# Patient Record
Sex: Female | Born: 1960 | Race: White | Hispanic: No | Marital: Married | Smoking: Never smoker
Health system: Southern US, Community
[De-identification: ages and names within clinical notes are randomized; demographics above are authoritative.]

## PROBLEM LIST (undated history)

## (undated) DIAGNOSIS — I10 Essential (primary) hypertension: Secondary | ICD-10-CM

## (undated) DIAGNOSIS — C801 Malignant (primary) neoplasm, unspecified: Secondary | ICD-10-CM

## (undated) DIAGNOSIS — F329 Major depressive disorder, single episode, unspecified: Secondary | ICD-10-CM

## (undated) DIAGNOSIS — F32A Depression, unspecified: Secondary | ICD-10-CM

## (undated) DIAGNOSIS — F419 Anxiety disorder, unspecified: Secondary | ICD-10-CM

## (undated) HISTORY — PX: DILATION AND CURETTAGE OF UTERUS: SHX78

## (undated) HISTORY — PX: CERVICAL SPINE SURGERY: SHX589

## (undated) HISTORY — PX: ABDOMINAL HYSTERECTOMY: SHX81

## (undated) HISTORY — PX: MASTECTOMY: SHX3

## (undated) HISTORY — PX: BLADDER SUSPENSION: SHX72

## (undated) SURGERY — RECONSTRUCTION, BREAST
Anesthesia: General | Laterality: Bilateral

---

## 2006-03-06 ENCOUNTER — Encounter: Admission: RE | Admit: 2006-03-06 | Discharge: 2006-03-06 | Payer: Self-pay | Admitting: Unknown Physician Specialty

## 2006-08-05 ENCOUNTER — Inpatient Hospital Stay (HOSPITAL_COMMUNITY): Admission: RE | Admit: 2006-08-05 | Discharge: 2006-08-06 | Payer: Self-pay | Admitting: Neurosurgery

## 2008-06-14 ENCOUNTER — Inpatient Hospital Stay (HOSPITAL_COMMUNITY): Admission: RE | Admit: 2008-06-14 | Discharge: 2008-06-17 | Payer: Self-pay | Admitting: Neurosurgery

## 2008-06-14 IMAGING — CR DG CHEST 2V
2 series · 2 of 2 positions shown · non-contrast
Comparison: [DATE].

CLINICAL DATA: Preop cervical spondylosis.

CHEST - 2 VIEW

[view not recorded (1 of 2)]
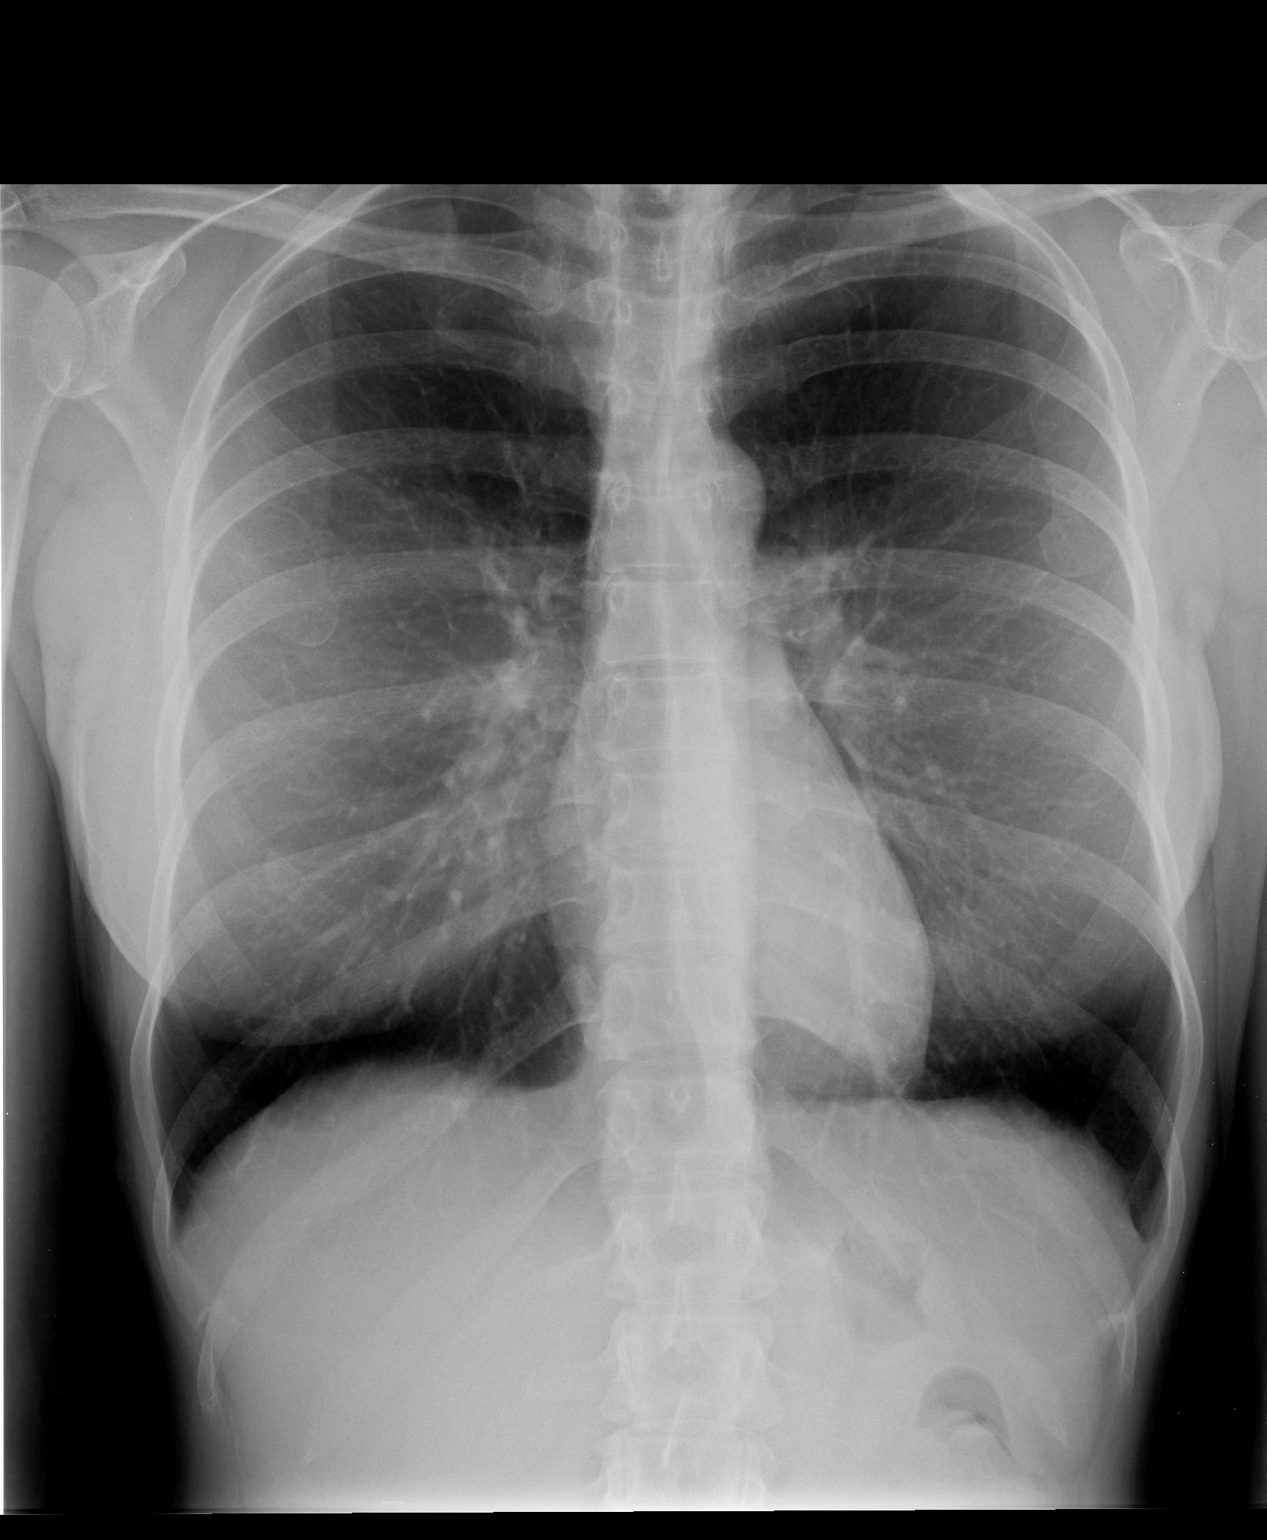

[view not recorded (2 of 2)]
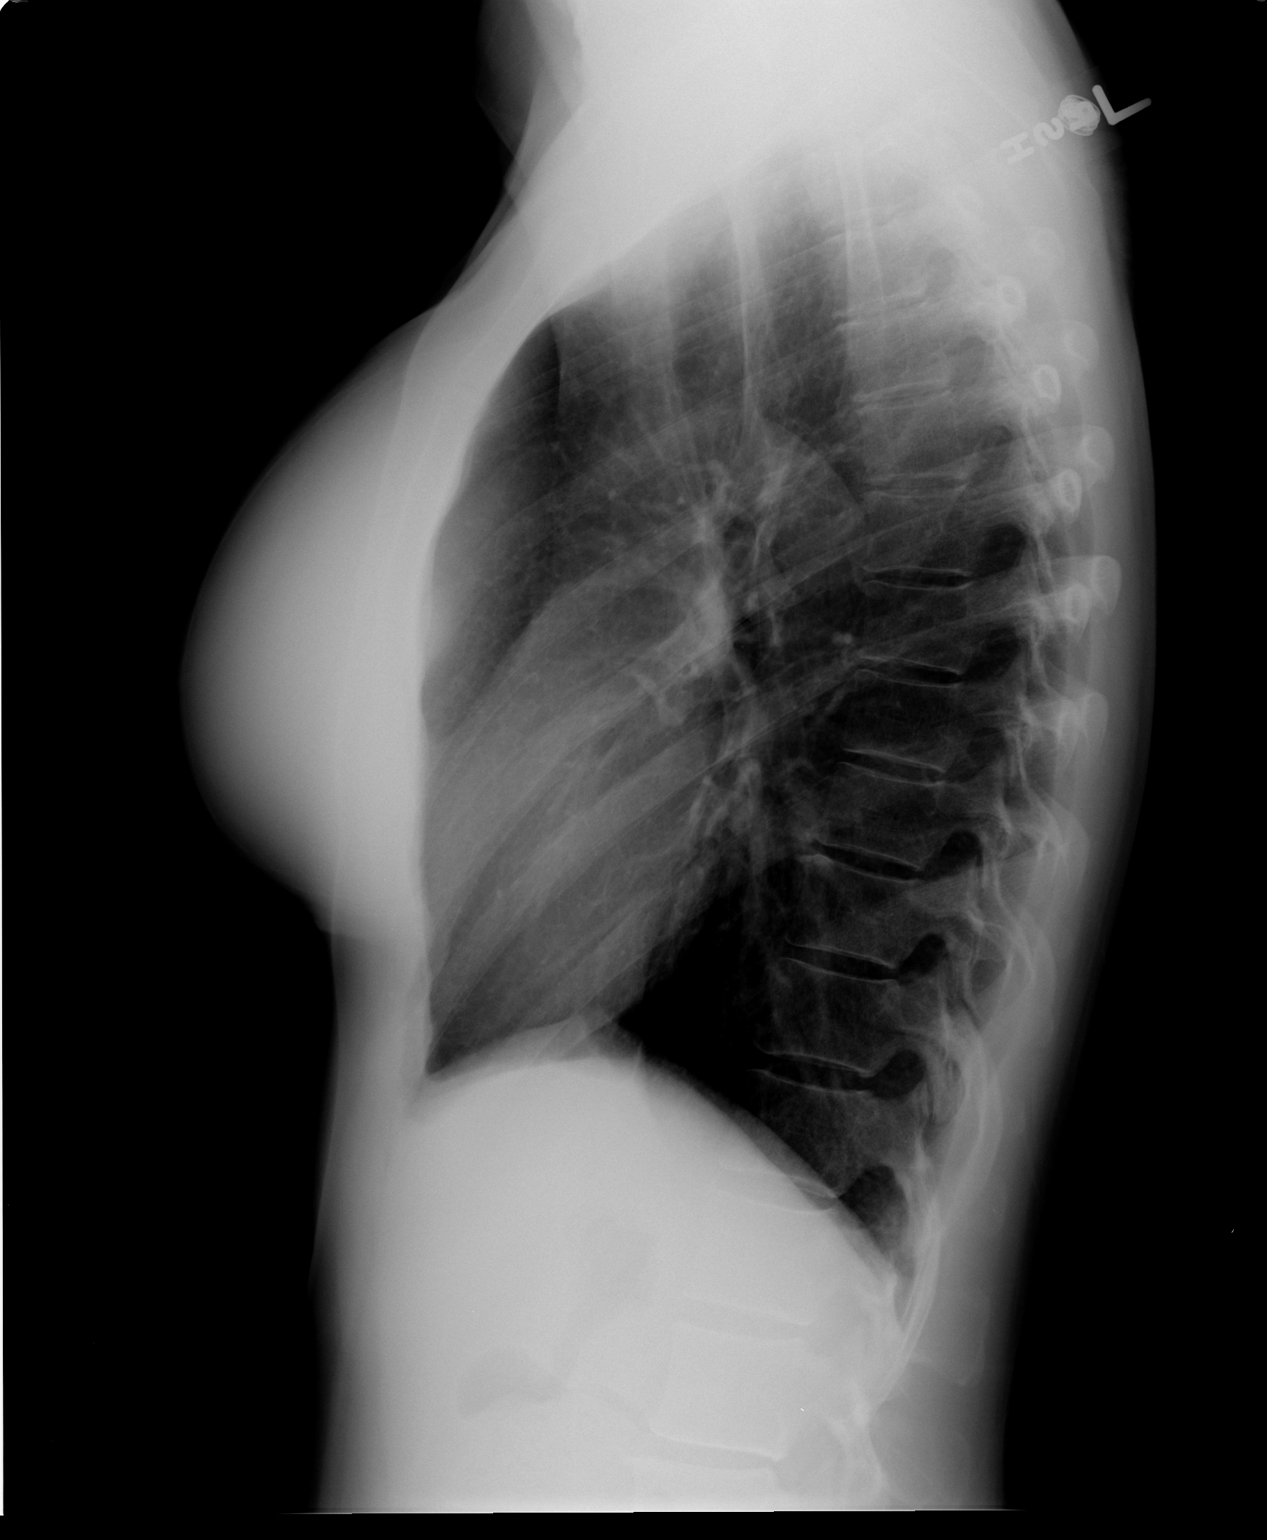

[2 of 2 positions shown; findings below may reference images not displayed]

FINDINGS: The cardiac and mediastinal silhouette appear normal.
The lung fields are clear.  There is no bony abnormality.  No
effusion or pneumothorax is seen.  The cervical plate is
identified in the lower neck.
IMPRESSION: No active disease, and no significant change from priors.

## 2009-11-30 ENCOUNTER — Observation Stay (HOSPITAL_COMMUNITY): Admission: EM | Admit: 2009-11-30 | Discharge: 2009-11-30 | Payer: Self-pay | Admitting: Emergency Medicine

## 2009-11-30 ENCOUNTER — Ambulatory Visit: Payer: Self-pay | Admitting: Internal Medicine

## 2009-11-30 ENCOUNTER — Encounter (INDEPENDENT_AMBULATORY_CARE_PROVIDER_SITE_OTHER): Payer: Self-pay | Admitting: Emergency Medicine

## 2009-12-17 ENCOUNTER — Ambulatory Visit: Payer: Self-pay | Admitting: Diagnostic Radiology

## 2009-12-17 ENCOUNTER — Emergency Department (HOSPITAL_BASED_OUTPATIENT_CLINIC_OR_DEPARTMENT_OTHER): Admission: EM | Admit: 2009-12-17 | Discharge: 2009-12-17 | Payer: Self-pay | Admitting: Emergency Medicine

## 2010-03-27 ENCOUNTER — Encounter
Admission: RE | Admit: 2010-03-27 | Discharge: 2010-03-27 | Payer: Self-pay | Source: Home / Self Care | Attending: Neurosurgery | Admitting: Neurosurgery

## 2010-04-22 ENCOUNTER — Encounter: Payer: Self-pay | Admitting: Unknown Physician Specialty

## 2010-06-14 LAB — URINALYSIS, ROUTINE W REFLEX MICROSCOPIC
Bilirubin Urine: NEGATIVE
Hgb urine dipstick: NEGATIVE
Nitrite: NEGATIVE
Protein, ur: NEGATIVE mg/dL
Specific Gravity, Urine: 1.012 (ref 1.005–1.030)
Urobilinogen, UA: 0.2 mg/dL (ref 0.0–1.0)
pH: 6 (ref 5.0–8.0)

## 2010-06-14 LAB — DIFFERENTIAL
Basophils Absolute: 0 10*3/uL (ref 0.0–0.1)
Basophils Relative: 1 % (ref 0–1)
Eosinophils Relative: 3 % (ref 0–5)
Lymphs Abs: 2.3 10*3/uL (ref 0.7–4.0)
Monocytes Absolute: 0.5 10*3/uL (ref 0.1–1.0)
Monocytes Relative: 8 % (ref 3–12)

## 2010-06-14 LAB — BASIC METABOLIC PANEL
BUN: 12 mg/dL (ref 6–23)
Chloride: 102 mEq/L (ref 96–112)
GFR calc non Af Amer: >60 mL/min (ref >60)
Potassium: 4.4 mEq/L (ref 3.5–5.1)

## 2010-06-14 LAB — CBC
HCT: 43.1 % (ref 36.0–46.0)
Hemoglobin: 15 g/dL (ref 12.0–15.0)
MCV: 95.6 fL (ref 78.0–100.0)
WBC: 6.2 10*3/uL (ref 4.0–10.5)

## 2010-06-14 LAB — POCT CARDIAC MARKERS: Myoglobin, poc: 44.4 ng/mL (ref 12–200)

## 2010-07-12 LAB — COMPREHENSIVE METABOLIC PANEL
ALT: 23 U/L (ref 0–35)
AST: 29 U/L (ref 0–37)
Albumin: 3.6 g/dL (ref 3.5–5.2)
CO2: 28 mEq/L (ref 19–32)
Chloride: 104 mEq/L (ref 96–112)
Glucose, Bld: 60 mg/dL — ABNORMAL LOW (ref 70–99)
Sodium: 140 mEq/L (ref 135–145)
Total Protein: 6.3 g/dL (ref 6.0–8.3)

## 2010-07-12 LAB — CBC
HCT: 39.2 % (ref 36.0–46.0)
MCHC: 35.1 g/dL (ref 30.0–36.0)
MCV: 94.6 fL (ref 78.0–100.0)
WBC: 5.5 10*3/uL (ref 4.0–10.5)

## 2010-07-12 LAB — URINALYSIS, ROUTINE W REFLEX MICROSCOPIC
Bilirubin Urine: NEGATIVE
Protein, ur: NEGATIVE mg/dL
Urobilinogen, UA: 0.2 mg/dL (ref 0.0–1.0)

## 2010-07-12 LAB — URINE MICROSCOPIC-ADD ON

## 2010-07-12 LAB — DIFFERENTIAL
Basophils Relative: 0 % (ref 0–1)
Eosinophils Absolute: 0.1 10*3/uL (ref 0.0–0.7)
Eosinophils Relative: 2 % (ref 0–5)
Lymphocytes Relative: 43 % (ref 12–46)
Monocytes Relative: 9 % (ref 3–12)
Neutro Abs: 2.5 10*3/uL (ref 1.7–7.7)

## 2010-07-12 LAB — APTT: aPTT: 28 seconds (ref 24–37)

## 2010-08-14 NOTE — Discharge Summary (Signed)
NAMERASHUNDA, PASSON                 ACCOUNT NO.:  [id number]   MEDICAL RECORD NO.:  [id number]          PATIENT TYPE:  INP   LOCATION:  3014                         FACILITY:  [facility]   PHYSICIAN:  Payton Doughty, M.D.      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   PROCEDURE:  C6-7 posterior cervical fusion.   DICTATING DOCTOR:  Payton Doughty, MD   COMPLICATIONS:  None.   DISCHARGE STATUS:  Alive and well.   BODY OF THE TEXT:  A 50 year old right handed white girl whose history  and physical is recounted in the chart.  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  She was admitted for  posterior fusion.   MEDICAL HISTORY:  Remarkable for breast cancer, no troubles now.   MEDICATIONS:  Celebrex, Elavil, and Vicodin.   ALLERGIES:  None.   General exam is intact.  Neurologic exam is intact with neck pain with  neck motion.  Films showed nonunion.  She was admitted after  ascertaining normal laboratory values and underwent posterior cervical  fusion.  Postoperatively, she has done well.  Obviously quite sore after  the muscle dissection.  Spent 1 day getting up and about, another day  mobilizing and getting her feet under.  She did not feel particularly  well yesterday.  Today, she feels much better and ready to go home, meet  her own needs at home.  Her strength is full.  Her incision is dry and  well healed.  She is being discharged home to the care of her family  with Vicodin for pain and her followup will be in the [facility]  for sutures in about 10 days.           ______________________________  Payton Doughty, M.D.     MWR/MEDQ  D:  06/17/2008  T:  06/17/2008  Job:  [id number]

## 2010-08-14 NOTE — H&P (Signed)
Michele Hahn, Michele Hahn                 ACCOUNT NO.:  [id number]   MEDICAL RECORD NO.:  [id number]          PATIENT TYPE:  INP   LOCATION:  3014                         FACILITY:  [facility]   PHYSICIAN:  Payton Doughty, M.D.      DATE OF BIRTH:  [birth year]   DATE OF ADMISSION:  06/14/2008  DATE OF DISCHARGE:                              HISTORY & PHYSICAL   ADMISSION DIAGNOSIS:  Nonunion at C6-C7.   DICTATING DOCTOR:  Payton Doughty, MD   SERVICE:  Neurosurgery.   BODY OF TEXT:  A very nice now 50 year old right-handed white lady who I  operated in 2007.  She had an anterior decompression and fusion and has  done relatively well.  She has increasing neck pain and plain films with  flexion/extension views showed a nonunion at C6-C7, fusion at C5-6 with  the solid C6-7 has not worked.  Screws are probably loosened at C6-C7  and she is now admitted for posterior revision of her fusion.   MEDICAL HISTORY:  Remarkable for breast cancer.  She had mastectomy in  April 2000, bladder sling in April 2006.   MEDICATIONS:  Celebrex, Elavil, and Vicodin.   She has no allergies.   SOCIAL HISTORY:  She does not smoke or drink.  She stays at home with  her children.   FAMILY HISTORY:  Mom is now 7.  Dad is 18, in good health.   REVIEW OF SYSTEMS:  Remarkable for back pain and neck pain.   PHYSICAL EXAMINATION:  HEENT:  Within normal limits.  NECK:  She has good range of motion of her neck.  CHEST:  Clear.  CARDIAC:  Regular rate and rhythm.  No murmur.  ABDOMEN:  Nontender.  No hepatosplenomegaly.  EXTREMITIES:  Without clubbing or cyanosis.  GU:  Deferred.  Peripheral pulses are good.  NEUROLOGIC:  She is awake, alert, and oriented.  Cranial nerves are  intact.  Motor exam shows 5/5 strength throughout the upper and lower  extremities.  She has pain with motion in her neck.  No current sensory  deficit.  Reflexes are intact.  Hoffman's is negative.   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   PLAN:  The plan is for a posterior cervical fusion at that level.  The  risks and benefits have been discussed with her and she wished to  proceed.           ______________________________  Payton Doughty, M.D.     MWR/MEDQ  D:  06/14/2008  T:  06/14/2008  Job:  [id number]

## 2010-08-14 NOTE — Op Note (Signed)
[name], Michele Hahn                 ACCOUNT NO.:  [id number]   MEDICAL RECORD NO.:  [id number]          PATIENT TYPE:  INP   LOCATION:  3014                         FACILITY:  [facility]   PHYSICIAN:  Payton Doughty, M.D.      DATE OF BIRTH:  1960/08/24   DATE OF PROCEDURE:  06/14/2008  DATE OF DISCHARGE:                               OPERATIVE REPORT   PREOPERATIVE DIAGNOSIS:  Nonunion C6-7.   POSTOPERATIVE DIAGNOSIS:  Nonunion C6-7.   PROCEDURE:  C6-7 posterior cervical fusion.   SURGEON:  Payton Doughty, MD, Neurosurgery.   ASSISTANT:  Hewitt Shorts, MD   ANESTHESIA:  General endotracheal.   PREPARATION:  Prepped and scrubbed with alcohol wipe.   COMPLICATIONS:  None.   BODY TEXT:  A 50 year old with fusion at C5-6 and C6-7 and a nonunion at  C6-7.  Taken to the operating room, smoothly anesthetized, intubated on  the Stryker bed, placed prone with pressure points padded.  Following  shave, prep, and drape in the usual sterile fashion, the skin was  incised over the C6-7 lamina and they were dissected free in the  subperiosteal plane, out over the lateral masses.  The intraoperative x-  ray showed with correct level.  Lateral mass screws were then placed in  C6 and C7 bilaterally using the standard landmarks.  They were connected  by rod and locking caps were placed and tightened.  The intraoperative x-  ray showed good placement of rods and screws.  The lamina and facet  joints were decorticated with a high-speed drill and packed with BMP on  the extender matrix.  The wound was irrigated.  Hemostasis was assured.  Successive layers of 0 Vicryl, 2-0 Vicryl, and 3-0 nylon were used to  close.  Betadine and Telfa dressing was applied and made occlusive with  OpSite, and the patient returned to the recovery room in good condition.           ______________________________  Payton Doughty, M.D.     MWR/MEDQ  D:  06/14/2008  T:  06/15/2008  Job:  [id number]

## 2010-08-17 NOTE — Op Note (Signed)
NAMERHYLEI, MCQUAIG                 ACCOUNT NO.:  [id number]   MEDICAL RECORD NO.:  [id number]          PATIENT TYPE:  AMB   LOCATION:  SDS                          FACILITY:  [facility]   PHYSICIAN:  Payton Doughty, M.D.      DATE OF BIRTH:  17-Jun-1960   DATE OF PROCEDURE:  08/05/2006  DATE OF DISCHARGE:                               OPERATIVE REPORT   PREOPERATIVE DIAGNOSIS:  Disk and spur at 5-6 and 6-7.   POSTOPERATIVE DIAGNOSES:  Disk and spur at 5-6 and 6-7.   OPERATIVE PROCEDURE:  C5-6, C6-7 anterior cervical decompression and  fusion with reflex hybrid plate.   SURGEON:  Payton Doughty, M.D.   ANESTHESIA:  General endotracheal anesthesia.   PREPARATION:  Prepped with alcohol wipe.   COMPLICATIONS:  None.   ASSISTANT:  No assistant.   DESCRIPTION OF PROCEDURE:  This is a 50 year old girl with spondylosis  and spur at C5-6 and C-6-7 taken to operating room, intubated, placed  supine on the operating table and the halter head traction with the neck  slightly extended.  Following shave, prep and drape in the usual sterile  fashion.  The skin was incised in the midline.  The medial border of the  sternocleidomastoid muscle on the left side, one fingerbreadth below the  level of carotid tubercle.  The platysma was identified, elevated,  divided and undermined.  Sternocleidomastoid was identified.  Medial  dissection revealed the carotid artery intact, laterally and to the  left.  Trachea and esophagus retracted laterally to the right, exposing  the bones of the anterior cervical spine.  Marker was placed.  Intraoperative x-ray obtained to confirm correct level.  Having  confirmed correct level, diskectomy was carried out at C5-6 and C6-7  under gross observation.  The operating microscope was then brought in.  We used microdissection technique to remove the remaining disks, explore  the neural foramen and divide the posterior longitudinal ligament.  On  the left side at each level  there was disk material out into the neural  foramen.  It  was removed without difficulty.  Neural foramina were  carefully explored and found to be open.  7 mm bone grafts were  fashioned with patellar allograft and tapped into place and the 34 mm  reflex hybrid plate was then placed with 12 mm screws, two in C-5, two  in C-6 and two in C-7.  Intraoperative x-ray showed good placement of  bone graft, plate and screws.  Wound was irrigated.  Hemostasis assured.  The esophagus carefully inspected, having no lesions.  Successive layers  of 3-0 Vicryl, 4-0 Vicryl used to close.  Benzoin, Steri-Strips were  placed.  A occlusive Telfa and OpSite.  The patient returned to recovery  room in good condition.          ______________________________  Payton Doughty, M.D.    MWR/MEDQ  D:  08/05/2006  T:  08/05/2006  Job:  [id number]

## 2010-08-17 NOTE — H&P (Signed)
Michele Hahn, BRINTON                 ACCOUNT NO.:  [id number]   MEDICAL RECORD NO.:  [id number]          PATIENT TYPE:  AMB   LOCATION:  SDS                          FACILITY:  [facility]   PHYSICIAN:  Payton Doughty, M.D.      DATE OF BIRTH:  03/09/1961   DATE OF ADMISSION:  08/05/2006  DATE OF DISCHARGE:                              HISTORY & PHYSICAL   ADMITTING DIAGNOSIS:  Spondylolysis, C5-6, C6-7.   SERVICE:  Neurosurgery.   HISTORY OF PRESENT ILLNESS:  This is a 50 year old, right-handed, white  lady who I saw initially for back pain in August.  She had a little bit  of a disc off the left, did well with epidural steroid injection.  In  the interim has developed pain in her neck with pain down her left arm  and subsequent MRI was obtained that demonstrated a disc at C6-7 and a  spur at 5-6, and she is admitted now for an anterior decompression and  fusion.   MEDICAL HISTORY:  Remarkable for breast cancer.  She had a mastectomy in  2000 and bladder sling in 2006.   MEDICATIONS:  Effexor.   ALLERGIES:  SHE HAS NO ALLERGIES.   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   FAMILY HISTORY:  Mom is 73 and in good health, dad 82 in good health.   REVIEW OF SYSTEMS:  Remarkable for back pain and pain to her leg.   HEENT EXAM:  Within normal limits.  NECK:  She has reasonable range of motion of the neck.  CHEST:  Clear.  CARDIAC EXAM:  Regular rate and rhythm without murmur.  ABDOMEN:  Nontender without hepatosplenomegaly.  EXTREMITIES:  Without clubbing or cyanosis.  GU EXAM:  Deferred.  Peripheral pulses are good.  NEUROLOGICALLY:  She is awake, alert, and oriented.  Cranial nerves are  intact.  Motor exam shows 5/5 strength throughout the upper extremities.  She does have pain when she turns her head towards her left.  She has no  triceps reflex on the left side.  Negative sensory deficits on the left  C7 distribution.  Lower extremities are  nonmyelopathic and Hoffmann is  negative.   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   PLAN:  Anterior decompression and fusion C5-6, C6-7.  The risks and  benefits have been discussed with her, and she wished to proceed.           ______________________________  Payton Doughty, M.D.     MWR/MEDQ  D:  08/05/2006  T:  08/05/2006  Job:  [id number]

## 2014-10-24 ENCOUNTER — Encounter (HOSPITAL_BASED_OUTPATIENT_CLINIC_OR_DEPARTMENT_OTHER): Admission: RE | Payer: Self-pay | Source: Ambulatory Visit

## 2014-10-24 ENCOUNTER — Ambulatory Visit (HOSPITAL_BASED_OUTPATIENT_CLINIC_OR_DEPARTMENT_OTHER): Admission: RE | Admit: 2014-10-24 | Payer: BLUE CROSS/BLUE SHIELD | Source: Ambulatory Visit | Admitting: Specialist

## 2014-10-24 SURGERY — REPLACEMENT, IMPLANT, BREAST
Anesthesia: General | Site: Breast | Laterality: Bilateral

## 2014-12-27 ENCOUNTER — Encounter (HOSPITAL_BASED_OUTPATIENT_CLINIC_OR_DEPARTMENT_OTHER): Payer: Self-pay | Admitting: *Deleted

## 2014-12-29 ENCOUNTER — Other Ambulatory Visit: Payer: Self-pay | Admitting: Specialist

## 2014-12-29 ENCOUNTER — Ambulatory Visit: Payer: Self-pay | Admitting: Specialist

## 2014-12-30 ENCOUNTER — Encounter (HOSPITAL_BASED_OUTPATIENT_CLINIC_OR_DEPARTMENT_OTHER)
Admission: RE | Admit: 2014-12-30 | Discharge: 2014-12-30 | Disposition: A | Discharge status: Home / Self Care | Payer: BLUE CROSS/BLUE SHIELD | Source: Ambulatory Visit | Attending: Specialist | Admitting: Specialist

## 2014-12-30 ENCOUNTER — Other Ambulatory Visit: Payer: Self-pay | Admitting: Specialist

## 2014-12-30 ENCOUNTER — Ambulatory Visit: Payer: Self-pay | Admitting: Specialist

## 2014-12-30 DIAGNOSIS — Z853 Personal history of malignant neoplasm of breast: Secondary | ICD-10-CM | POA: Diagnosis not present

## 2014-12-30 DIAGNOSIS — F419 Anxiety disorder, unspecified: Secondary | ICD-10-CM | POA: Diagnosis not present

## 2014-12-30 DIAGNOSIS — Z9013 Acquired absence of bilateral breasts and nipples: Secondary | ICD-10-CM | POA: Diagnosis not present

## 2014-12-30 DIAGNOSIS — I10 Essential (primary) hypertension: Secondary | ICD-10-CM | POA: Diagnosis not present

## 2014-12-30 DIAGNOSIS — F329 Major depressive disorder, single episode, unspecified: Secondary | ICD-10-CM | POA: Diagnosis not present

## 2014-12-30 DIAGNOSIS — N651 Disproportion of reconstructed breast: Secondary | ICD-10-CM | POA: Diagnosis not present

## 2014-12-30 DIAGNOSIS — Z79899 Other long term (current) drug therapy: Secondary | ICD-10-CM | POA: Diagnosis not present

## 2015-01-02 ENCOUNTER — Ambulatory Visit (HOSPITAL_BASED_OUTPATIENT_CLINIC_OR_DEPARTMENT_OTHER): Payer: BLUE CROSS/BLUE SHIELD | Admitting: Anesthesiology

## 2015-01-02 ENCOUNTER — Encounter (HOSPITAL_BASED_OUTPATIENT_CLINIC_OR_DEPARTMENT_OTHER)
Admission: RE | Disposition: A | Discharge status: Home / Self Care | Payer: Self-pay | Source: Ambulatory Visit | Attending: Specialist

## 2015-01-02 ENCOUNTER — Ambulatory Visit (HOSPITAL_BASED_OUTPATIENT_CLINIC_OR_DEPARTMENT_OTHER)
Admission: RE | Admit: 2015-01-02 | Discharge: 2015-01-02 | Disposition: A | Discharge status: Home / Self Care | Payer: BLUE CROSS/BLUE SHIELD | Source: Ambulatory Visit | Attending: Specialist | Admitting: Specialist

## 2015-01-02 ENCOUNTER — Encounter (HOSPITAL_BASED_OUTPATIENT_CLINIC_OR_DEPARTMENT_OTHER): Payer: Self-pay | Admitting: Anesthesiology

## 2015-01-02 DIAGNOSIS — F329 Major depressive disorder, single episode, unspecified: Secondary | ICD-10-CM | POA: Insufficient documentation

## 2015-01-02 DIAGNOSIS — Z853 Personal history of malignant neoplasm of breast: Secondary | ICD-10-CM | POA: Insufficient documentation

## 2015-01-02 DIAGNOSIS — Z79899 Other long term (current) drug therapy: Secondary | ICD-10-CM | POA: Insufficient documentation

## 2015-01-02 DIAGNOSIS — F419 Anxiety disorder, unspecified: Secondary | ICD-10-CM | POA: Insufficient documentation

## 2015-01-02 DIAGNOSIS — N651 Disproportion of reconstructed breast: Secondary | ICD-10-CM | POA: Insufficient documentation

## 2015-01-02 DIAGNOSIS — I10 Essential (primary) hypertension: Secondary | ICD-10-CM | POA: Insufficient documentation

## 2015-01-02 DIAGNOSIS — Z9013 Acquired absence of bilateral breasts and nipples: Secondary | ICD-10-CM | POA: Insufficient documentation

## 2015-01-02 HISTORY — PX: REMOVAL OF BILATERAL TISSUE EXPANDERS WITH PLACEMENT OF BILATERAL BREAST IMPLANTS: SHX6431

## 2015-01-02 HISTORY — DX: Malignant (primary) neoplasm, unspecified: C80.1

## 2015-01-02 HISTORY — DX: Depression, unspecified: F32.A

## 2015-01-02 HISTORY — DX: Major depressive disorder, single episode, unspecified: F32.9

## 2015-01-02 HISTORY — DX: Essential (primary) hypertension: I10

## 2015-01-02 HISTORY — DX: Anxiety disorder, unspecified: F41.9

## 2015-01-02 LAB — POCT HEMOGLOBIN-HEMACUE: HEMOGLOBIN: 14.4 g/dL (ref 12.0–15.0)

## 2015-01-02 SURGERY — REMOVAL, TISSUE EXPANDER, BREAST, BILATERAL, WITH BILATERAL IMPLANT IMPLANT INSERTION
Anesthesia: General | Laterality: Bilateral

## 2015-01-02 MED ORDER — PROPOFOL 10 MG/ML IV BOLUS
INTRAVENOUS | Status: DC | PRN
  Administered 2015-01-02: 200 mg via INTRAVENOUS

## 2015-01-02 MED ORDER — MEPERIDINE HCL 25 MG/ML IJ SOLN: 6.2500 mg | INTRAMUSCULAR | Status: DC | PRN

## 2015-01-02 MED ORDER — EPINEPHRINE HCL 1 MG/ML IJ SOLN
INTRAMUSCULAR | Status: AC
  Filled 2015-01-02: qty 1

## 2015-01-02 MED ORDER — FENTANYL CITRATE (PF) 100 MCG/2ML IJ SOLN
50.0000 ug | INTRAMUSCULAR | Status: DC | PRN
  Administered 2015-01-02: 100 ug via INTRAVENOUS
  Administered 2015-01-02: 25 ug via INTRAVENOUS

## 2015-01-02 MED ORDER — DEXAMETHASONE SODIUM PHOSPHATE 10 MG/ML IJ SOLN
INTRAMUSCULAR | Status: AC
  Filled 2015-01-02: qty 1

## 2015-01-02 MED ORDER — DEXAMETHASONE SODIUM PHOSPHATE 4 MG/ML IJ SOLN
INTRAMUSCULAR | Status: DC | PRN
  Administered 2015-01-02: 10 mg via INTRAVENOUS

## 2015-01-02 MED ORDER — EPHEDRINE SULFATE 50 MG/ML IJ SOLN
INTRAMUSCULAR | Status: DC | PRN
  Administered 2015-01-02: 10 mg via INTRAVENOUS

## 2015-01-02 MED ORDER — DEXTROSE 5 % IV SOLN
10.0000 mg | INTRAVENOUS | Status: DC | PRN
  Administered 2015-01-02: 50 ug/min via INTRAVENOUS

## 2015-01-02 MED ORDER — OXYCODONE HCL 5 MG/5ML PO SOLN: 5.0000 mg | Freq: Once | ORAL | Status: AC | PRN

## 2015-01-02 MED ORDER — HYDROMORPHONE HCL 1 MG/ML IJ SOLN
INTRAMUSCULAR | Status: AC
  Filled 2015-01-02: qty 1

## 2015-01-02 MED ORDER — SCOPOLAMINE 1 MG/3DAYS TD PT72
1.0000 | MEDICATED_PATCH | TRANSDERMAL | Status: DC
  Administered 2015-01-02: 1.5 mg via TRANSDERMAL

## 2015-01-02 MED ORDER — LACTATED RINGERS IV SOLN
INTRAVENOUS | Status: DC
  Administered 2015-01-02: 10:00:00 via INTRAVENOUS

## 2015-01-02 MED ORDER — CEFAZOLIN SODIUM-DEXTROSE 2-3 GM-% IV SOLR
2.0000 g | INTRAVENOUS | Status: AC
  Administered 2015-01-02: 2 g via INTRAVENOUS

## 2015-01-02 MED ORDER — LIDOCAINE HCL (CARDIAC) 20 MG/ML IV SOLN
INTRAVENOUS | Status: AC
  Filled 2015-01-02: qty 5

## 2015-01-02 MED ORDER — FENTANYL CITRATE (PF) 100 MCG/2ML IJ SOLN
INTRAMUSCULAR | Status: AC
  Filled 2015-01-02: qty 4

## 2015-01-02 MED ORDER — GLYCOPYRROLATE 0.2 MG/ML IJ SOLN
INTRAMUSCULAR | Status: AC
  Filled 2015-01-02: qty 1

## 2015-01-02 MED ORDER — SCOPOLAMINE 1 MG/3DAYS TD PT72
MEDICATED_PATCH | TRANSDERMAL | Status: AC
  Filled 2015-01-02: qty 1

## 2015-01-02 MED ORDER — LIDOCAINE HCL (CARDIAC) 20 MG/ML IV SOLN
INTRAVENOUS | Status: DC | PRN
  Administered 2015-01-02: 60 mg via INTRAVENOUS

## 2015-01-02 MED ORDER — MIDAZOLAM HCL 2 MG/2ML IJ SOLN
INTRAMUSCULAR | Status: AC
  Filled 2015-01-02: qty 4

## 2015-01-02 MED ORDER — OXYCODONE HCL 5 MG PO TABS
ORAL_TABLET | ORAL | Status: AC
  Filled 2015-01-02: qty 1

## 2015-01-02 MED ORDER — SODIUM BICARBONATE 4 % IV SOLN
INTRAVENOUS | Status: AC
  Filled 2015-01-02: qty 20

## 2015-01-02 MED ORDER — GLYCOPYRROLATE 0.2 MG/ML IJ SOLN
0.2000 mg | Freq: Once | INTRAMUSCULAR | Status: AC | PRN
  Administered 2015-01-02: 0.2 mg via INTRAVENOUS

## 2015-01-02 MED ORDER — LIDOCAINE HCL 2 % IJ SOLN
INTRAMUSCULAR | Status: AC
  Filled 2015-01-02: qty 80

## 2015-01-02 MED ORDER — ONDANSETRON HCL 4 MG/2ML IJ SOLN
INTRAMUSCULAR | Status: AC
  Filled 2015-01-02: qty 2

## 2015-01-02 MED ORDER — OXYCODONE HCL 5 MG PO TABS
5.0000 mg | ORAL_TABLET | Freq: Once | ORAL | Status: AC | PRN
  Administered 2015-01-02: 5 mg via ORAL

## 2015-01-02 MED ORDER — PROPOFOL 10 MG/ML IV BOLUS
INTRAVENOUS | Status: AC
  Filled 2015-01-02: qty 20

## 2015-01-02 MED ORDER — MIDAZOLAM HCL 2 MG/2ML IJ SOLN
1.0000 mg | INTRAMUSCULAR | Status: DC | PRN
  Administered 2015-01-02: 2 mg via INTRAVENOUS

## 2015-01-02 MED ORDER — HYDROMORPHONE HCL 1 MG/ML IJ SOLN
0.2500 mg | INTRAMUSCULAR | Status: DC | PRN
  Administered 2015-01-02 (×4): 0.5 mg via INTRAVENOUS

## 2015-01-02 MED ORDER — SODIUM CHLORIDE 0.9 % IV SOLN
INTRAVENOUS | Status: DC | PRN
  Administered 2015-01-02: 200 mL via INTRAMUSCULAR

## 2015-01-02 SURGICAL SUPPLY — 70 items
APL SKNCLS STERI-STRIP NONHPOA (GAUZE/BANDAGES/DRESSINGS) ×1
BAG DECANTER FOR FLEXI CONT (MISCELLANEOUS) ×3 IMPLANT
BENZOIN TINCTURE PRP APPL 2/3 (GAUZE/BANDAGES/DRESSINGS) ×3 IMPLANT
BINDER BREAST LRG (GAUZE/BANDAGES/DRESSINGS) ×3 IMPLANT
BINDER BREAST MEDIUM (GAUZE/BANDAGES/DRESSINGS) ×1 IMPLANT
BINDER BREAST XLRG (GAUZE/BANDAGES/DRESSINGS) ×1 IMPLANT
BINDER BREAST XXLRG (GAUZE/BANDAGES/DRESSINGS) ×3 IMPLANT
BLADE KNIFE PERSONA 10 (BLADE) ×3 IMPLANT
BLADE KNIFE PERSONA 15 (BLADE) ×3 IMPLANT
CANISTER SUCT 1200ML W/VALVE (MISCELLANEOUS) ×3 IMPLANT
COVER BACK TABLE 60X90IN (DRAPES) ×3 IMPLANT
COVER MAYO STAND STRL (DRAPES) ×3 IMPLANT
DECANTER SPIKE VIAL GLASS SM (MISCELLANEOUS) ×3 IMPLANT
DRAIN CHANNEL 10M FLAT 3/4 FLT (DRAIN) ×1 IMPLANT
DRAIN PENROSE 1/4X12 LTX STRL (WOUND CARE) IMPLANT
DRAPE LAPAROSCOPIC ABDOMINAL (DRAPES) ×3 IMPLANT
DRSG PAD ABDOMINAL 8X10 ST (GAUZE/BANDAGES/DRESSINGS) ×6 IMPLANT
ELECT BLADE 6.5 .24CM SHAFT (ELECTRODE) IMPLANT
ELECT REM PT RETURN 9FT ADLT (ELECTROSURGICAL) ×3
ELECTRODE REM PT RTRN 9FT ADLT (ELECTROSURGICAL) ×1 IMPLANT
EVACUATOR SILICONE 100CC (DRAIN) ×1 IMPLANT
FILTER 7/8 IN (FILTER) ×3 IMPLANT
GAUZE SPONGE 4X4 12PLY STRL (GAUZE/BANDAGES/DRESSINGS) ×3 IMPLANT
GAUZE XEROFORM 5X9 LF (GAUZE/BANDAGES/DRESSINGS) ×3 IMPLANT
GLOVE BIO SURGEON STRL SZ 6.5 (GLOVE) ×2 IMPLANT
GLOVE BIO SURGEONS STRL SZ 6.5 (GLOVE) ×1
GLOVE BIOGEL M STRL SZ7.5 (GLOVE) ×3 IMPLANT
GLOVE BIOGEL PI IND STRL 8 (GLOVE) ×1 IMPLANT
GLOVE BIOGEL PI INDICATOR 8 (GLOVE) ×4
GLOVE ECLIPSE 7.0 STRL STRAW (GLOVE) ×3 IMPLANT
GOWN STRL REUS W/ TWL LRG LVL3 (GOWN DISPOSABLE) ×1 IMPLANT
GOWN STRL REUS W/ TWL XL LVL3 (GOWN DISPOSABLE) ×1 IMPLANT
GOWN STRL REUS W/TWL LRG LVL3 (GOWN DISPOSABLE)
GOWN STRL REUS W/TWL XL LVL3 (GOWN DISPOSABLE) ×6
IMPL BREAST SILICONE 640CC (Breast) IMPLANT
IMPLANT BREAST SILICONE 640CC (Breast) ×6 IMPLANT
IV NS 500ML (IV SOLUTION) ×3
IV NS 500ML BAXH (IV SOLUTION) ×4 IMPLANT
KIT FILL SYSTEM UNIVERSAL (SET/KITS/TRAYS/PACK) ×1 IMPLANT
NDL HYPO 25X1 1.5 SAFETY (NEEDLE) IMPLANT
NDL SPNL 18GX3.5 QUINCKE PK (NEEDLE) IMPLANT
NEEDLE HYPO 25X1 1.5 SAFETY (NEEDLE) ×3 IMPLANT
NEEDLE SPNL 18GX3.5 QUINCKE PK (NEEDLE) ×3 IMPLANT
NS IRRIG 1000ML POUR BTL (IV SOLUTION) ×1 IMPLANT
PACK BASIN DAY SURGERY FS (CUSTOM PROCEDURE TRAY) ×3 IMPLANT
PEN SKIN MARKING BROAD TIP (MISCELLANEOUS) ×3 IMPLANT
PIN SAFETY STERILE (MISCELLANEOUS) IMPLANT
SHEETING SILICONE GEL EPI DERM (MISCELLANEOUS) ×2 IMPLANT
SLEEVE SCD COMPRESS KNEE MED (MISCELLANEOUS) ×3 IMPLANT
SPONGE LAP 18X18 X RAY DECT (DISPOSABLE) ×10 IMPLANT
STRIP SUTURE WOUND CLOSURE 1/2 (SUTURE) IMPLANT
SUT MNCRL AB 3-0 PS2 18 (SUTURE) ×5 IMPLANT
SUT MON AB 2-0 CT1 36 (SUTURE) ×5 IMPLANT
SUT MON AB 5-0 PS2 18 (SUTURE) IMPLANT
SUT PROLENE 3 0 PS 2 (SUTURE) IMPLANT
SYR 20CC LL (SYRINGE) IMPLANT
SYR 50ML LL SCALE MARK (SYRINGE) ×6 IMPLANT
SYR BULB IRRIGATION 50ML (SYRINGE) ×3 IMPLANT
SYR CONTROL 10ML LL (SYRINGE) ×3 IMPLANT
TAPE HYPAFIX 6 X30' (GAUZE/BANDAGES/DRESSINGS) ×1
TAPE HYPAFIX 6X30 (GAUZE/BANDAGES/DRESSINGS) ×2 IMPLANT
TAPE MEASURE 72IN RETRACT (INSTRUMENTS)
TAPE MEASURE LINEN 72IN RETRCT (INSTRUMENTS) IMPLANT
TOWEL OR 17X24 6PK STRL BLUE (TOWEL DISPOSABLE) ×12 IMPLANT
TRAY DSU PREP LF (CUSTOM PROCEDURE TRAY) ×3 IMPLANT
TUBE CONNECTING 20'X1/4 (TUBING) ×1
TUBE CONNECTING 20X1/4 (TUBING) ×2 IMPLANT
UNDERPAD 30X30 (UNDERPADS AND DIAPERS) ×6 IMPLANT
VAC PENCILS W/TUBING CLEAR (MISCELLANEOUS) ×3 IMPLANT
YANKAUER SUCT BULB TIP NO VENT (SUCTIONS) ×3 IMPLANT

## 2015-01-02 NOTE — Brief Op Note (Signed)
01/02/2015  11:22 AM  PATIENT:  Michele Hahn  54 y.o. female  PRE-OPERATIVE DIAGNOSIS:  BREAST CANCER  POST-OPERATIVE DIAGNOSIS:  BREAST CANCER  PROCEDURE:  Procedure(s): REMOVAL OF BILATERAL WITH PLACEMENT OF BILATERAL BREAST IMPLANTS (Bilateral)  SURGEON:  Surgeon(s) and Role:    * Cristine Polio, MD - Primary  PHYSICIAN ASSISTANT:   ASSISTANTS: none   ANESTHESIA:   general  EBL:  Total I/O In: 1200 [I.V.:1200] Out: -   BLOOD ADMINISTERED:none  DRAINS: none   LOCAL MEDICATIONS USED:  LIDOCAINE   SPECIMEN:  Excision  DISPOSITION OF SPECIMEN:  PATHOLOGY  COUNTS:  YES  TOURNIQUET:  * No tourniquets in log *  DICTATION: .Other Dictation: Dictation Number [id number]  PLAN OF CARE: Discharge to home after PACU  PATIENT DISPOSITION:  PACU - hemodynamically stable.   Delay start of Pharmacological VTE agent (>24hrs) due to surgical blood loss or risk of bleeding: yes

## 2015-01-02 NOTE — Anesthesia Postprocedure Evaluation (Signed)
  Anesthesia Post-op Note  Patient: Michele Hahn  Procedure(s) Performed: Procedure(s): REMOVAL OF BILATERAL WITH PLACEMENT OF BILATERAL BREAST IMPLANTS (Bilateral)  Patient Location: PACU  Anesthesia Type: General   Level of Consciousness: awake, alert  and oriented  Airway and Oxygen Therapy: Patient Spontanous Breathing  Post-op Pain: mild  Post-op Assessment: Post-op Vital signs reviewed  Post-op Vital Signs: Reviewed  Last Vitals:  Filed Vitals:   01/02/15 1200  BP: 132/74  Pulse: 84  Temp:   Resp: 17    Complications: No apparent anesthesia complications

## 2015-01-02 NOTE — Discharge Instructions (Signed)
Activity (include date of return to work if known) °As tolerated: NO showers °NO driving °No heavy activities ° °Diet:regular No restrictions: ° °Wound Care: Keep dressing clean & dry ° °Do not change dressings °For Abdominoplasties wear abdominal binder °Special Instructions: °Do not raise arms up °Continue to empty, recharge, & record drainage from J-P drains &/or °Hemovacs 2-3 times a day, as needed. °Call Doctor if any unusual problems occur such as pain, excessive °Bleeding, unrelieved Nausea/vomiting, Fever &/or chills °When lying down, keep head elevated on 2-3 pillows or back-rest °For Addominoplasties the Jack-knife position °Follow-up appointment: Please call the office. ° °The patient received discharge instruction from:___________________________________________ ° ° °Patient signature ________________________________________ / Date___________ ° ° ° °Signature of individual providing instructions/ Date________________     ° ° °Post Anesthesia Home Care Instructions ° °Activity: °Get plenty of rest for the remainder of the day. A responsible adult should stay with you for 24 hours following the procedure.  °For the next 24 hours, DO NOT: °-Drive a car °-Operate machinery °-Drink alcoholic beverages °-Take any medication unless instructed by your physician °-Make any legal decisions or sign important papers. ° °Meals: °Start with liquid foods such as gelatin or soup. Progress to regular foods as tolerated. Avoid greasy, spicy, heavy foods. If nausea and/or vomiting occur, drink only clear liquids until the nausea and/or vomiting subsides. Call your physician if vomiting continues. ° °Special Instructions/Symptoms: °Your throat may feel dry or sore from the anesthesia or the breathing tube placed in your throat during surgery. If this causes discomfort, gargle with warm salt water. The discomfort should disappear within 24 hours. ° °If you had a scopolamine patch placed behind your ear for the management of  post- operative nausea and/or vomiting: ° °1. The medication in the patch is effective for 72 hours, after which it should be removed.  Wrap patch in a tissue and discard in the trash. Wash hands thoroughly with soap and water. °2. You may remove the patch earlier than 72 hours if you experience unpleasant side effects which may include dry mouth, dizziness or visual disturbances. °3. Avoid touching the patch. Wash your hands with soap and water after contact with the patch. °  °         °

## 2015-01-02 NOTE — Transfer of Care (Signed)
Immediate Anesthesia Transfer of Care Note  Patient: Michele Hahn  Procedure(s) Performed: Procedure(s): REMOVAL OF BILATERAL WITH PLACEMENT OF BILATERAL BREAST IMPLANTS (Bilateral)  Patient Location: PACU  Anesthesia Type:General  Level of Consciousness: awake and sedated  Airway & Oxygen Therapy: Patient Spontanous Breathing and Patient connected to face mask oxygen  Post-op Assessment: Report given to RN and Post -op Vital signs reviewed and stable  Post vital signs: Reviewed and stable  Last Vitals:  Filed Vitals:   01/02/15 0932  BP: 115/67  Pulse: 69  Temp: 36.8 C  Resp: 18    Complications: No apparent anesthesia complications

## 2015-01-02 NOTE — Anesthesia Preprocedure Evaluation (Signed)
Anesthesia Evaluation  Patient identified by MRN, date of birth, ID band Patient awake    Reviewed: Allergy & Precautions, NPO status , Patient's Chart, lab work & pertinent test results  Airway Mallampati: I  TM Distance: >3 FB Neck ROM: Full    Dental  (+) Teeth Intact, Dental Advisory Given   Pulmonary    breath sounds clear to auscultation       Cardiovascular hypertension, Pt. on medications  Rhythm:Regular Rate:Normal     Neuro/Psych    GI/Hepatic   Endo/Other    Renal/GU      Musculoskeletal   Abdominal   Peds  Hematology   Anesthesia Other Findings   Reproductive/Obstetrics                             Anesthesia Physical Anesthesia Plan  ASA: II  Anesthesia Plan: General   Post-op Pain Management:    Induction: Intravenous  Airway Management Planned: LMA  Additional Equipment:   Intra-op Plan:   Post-operative Plan: Extubation in OR  Informed Consent: I have reviewed the patients History and Physical, chart, labs and discussed the procedure including the risks, benefits and alternatives for the proposed anesthesia with the patient or authorized representative who has indicated his/her understanding and acceptance.   Dental advisory given  Plan Discussed with: CRNA, Anesthesiologist and Surgeon  Anesthesia Plan Comments:         Anesthesia Quick Evaluation  

## 2015-01-02 NOTE — H&P (Signed)
Michele Hahn is an 54 y.o. female.   Chief Complaint: Implant ruptures bilateral HPI: SP BILATERAL reconstructions for breast cancer 2000  Had bilateral reconstructions with implants that have ruptured at least twice  For removal and exchanges  Past Medical History  Diagnosis Date  . Hypertension   . Depression   . Anxiety   . Cancer [facility])     left breast cancer, bil mastectomies 2000    Past Surgical History  Procedure Laterality Date  . Mastectomy      bil mastectomies 2000  . Abdominal hysterectomy    . Dilation and curettage of uterus    . Cervical spine surgery    . Bladder suspension      History reviewed. No pertinent family history. Social History:  reports that she has never smoked. She does not have any smokeless tobacco history on file. She reports that she drinks alcohol. She reports that she does not use illicit drugs.  Allergies:  Allergies  Allergen Reactions  . Septra [Sulfamethoxazole-Trimethoprim] Rash    Medications Prior to Admission  Medication Sig Dispense Refill  . amLODipine (NORVASC) 5 MG tablet Take 5 mg by mouth daily.    . clonazePAM (KLONOPIN) 0.5 MG tablet Take 0.5 mg by mouth 2 (two) times daily as needed for anxiety.    . temazepam (RESTORIL) 15 MG capsule Take 15 mg by mouth at bedtime as needed for sleep.    Marland Kitchen venlafaxine XR (EFFEXOR-XR) 150 MG 24 hr capsule Take 150 mg by mouth daily with breakfast.      No results found for this or any previous visit (from the past 48 hour(s)). No results found.  Review of Systems  Constitutional: Negative.   HENT: Negative.   Eyes: Negative.   Respiratory: Negative.  Negative for hemoptysis.   Cardiovascular: Negative.   Gastrointestinal: Negative.   Genitourinary: Negative.   Musculoskeletal: Negative.   Skin: Positive for rash.  Neurological: Negative.   Endo/Heme/Allergies: Negative.   Psychiatric/Behavioral: Negative.     Blood pressure 115/67, pulse 69, temperature 98.2 F (36.8 C),  temperature source Oral, resp. rate 18, height 5\' 5"  (1.651 m), weight 59.875 kg (132 lb), SpO2 100 %. Physical Exam   Assessment/Plan Ruptured implants after breast reconstructions for removal and replacement  Josejuan Hoaglin L 01/02/2015, 9:49 AM

## 2015-01-02 NOTE — Anesthesia Procedure Notes (Signed)
Procedure Name: LMA Insertion Performed by: Terrance Mass Pre-anesthesia Checklist: Patient identified, Timeout performed, Emergency Drugs available, Suction available and Patient being monitored Patient Re-evaluated:Patient Re-evaluated prior to inductionOxygen Delivery Method: Circle system utilized Preoxygenation: Pre-oxygenation with 100% oxygen Intubation Type: IV induction Ventilation: Mask ventilation without difficulty LMA: LMA inserted LMA Size: 4.0 Number of attempts: 1 Airway Equipment and Method: Bite block Placement Confirmation: positive ETCO2 Tube secured with: Tape Dental Injury: Teeth and Oropharynx as per pre-operative assessment

## 2015-01-03 ENCOUNTER — Encounter (HOSPITAL_BASED_OUTPATIENT_CLINIC_OR_DEPARTMENT_OTHER): Payer: Self-pay | Admitting: Specialist

## 2015-01-03 NOTE — Op Note (Signed)
Michele, Hahn                 ACCOUNT NO.:  [id number]  MEDICAL RECORD NO.:  [id number]  LOCATION:                               FACILITY:  [facility]  PHYSICIAN:  Berneta Sages L. Towanda Malkin, M.D.DATE OF BIRTH:  [birth year]  DATE OF PROCEDURE:  01/02/2015 DATE OF DISCHARGE:  01/02/2015                              OPERATIVE REPORT   INDICATION:  The patient had right breast cancer back in 2000, she had several implant exchanges with deflation.  The left side seems to be down more than the right side.  PROCEDURES DONE:  Bilateral implants changes, capsulotomies and capsulectomies with replacement of Mentor Gel implants on the left side, serial number [id number]; and on the right side, serial number [id number] and they are 640 mL implants.  DESCRIPTION OF PROCEDURE:  Preoperatively, the patient underwent drawings to midline inframammary fold, anterior axillary line.  She underwent also drawings of scar tissue involving scar cicatrix involving the right and left side.  The left side is very hypertrophic and why this is going to be revised.  She underwent general anesthesia, intubated orally.  Prep was done to the chest and breast areas in routine fashion using Hibiclens soap and solution, walled off with sterile towels and drapes so as to make a sterile field.  We did the left side first.  0.5% of Xylocaine with epinephrine was injected locally, a total of 35 mL.  The previous transverse mid breast incision, which was very hypertrophied and widened, was excised, dissection then carried down to the pectoralis major muscle.  Muscle was very thin, and I was able to then dissect down to the capsule and capsulectomy done circumferentially to remove the embedded implants, taken off to set for evaluation.  After proper hemostasis, irrigation was done with saline. The air was replaced back with a Mentor total gel implant 640 mL, serial [id number] excellent symmetry.  The implant removed was a  550 mL implant.  We repaired the muscle and scar tissue back with 2-0 Monocryl. Subcutaneous was closed with 3-0 Monocryl x2 layers and a running subcuticular stitch of 3-0 Monocryl.  Same was done on the right side, but without doing scar revision.  Transverse incision opened with a #15 blade down to skin and underlying tissue to the pectoralis major muscle, the muscle dissecting capsulotomy and capsulectomy done on that side as well, removed the implant and replaced with a Mentor Gel implant on her right side, serial number [id number] with excellent symmetry as well. The muscle and the scar tissue were repaired back with 2-0 Monocryl. Subcutaneous tissue with 3-0 Monocryl x2 layers and a running subcuticular stitch of 3-0 Monocryl.  The scars were covered with silicon gel patches, Steri-Strips, soft dressings, and breast support. She tolerated all the procedures very well and was taken to the recovery in excellent condition.     Odella Aquas. Towanda Malkin, M.D.     Elie Confer  D:  01/02/2015  T:  01/02/2015  Job:  [id number]

## 2019-12-16 ENCOUNTER — Ambulatory Visit: Payer: Self-pay | Admitting: *Deleted

## 2019-12-16 NOTE — Telephone Encounter (Signed)
°   caller Leroy Libman requesting advise for patient with persistent symptoms of covid. Patient reports oxygen levels are low at 91-94% on RA. Patient has portable O2 pulse oximeter with pulse around 70's. Symptoms started 8 days ago. Fever has been at 101-103 today and now at 98.1. Dx with covid 12/12/19 per patient. SOB reported at rest. Poor po intake and lightheadedness, dry mouth prior to today. Denies difficulty breathing. Has been urinating.advised patient to call PCP and set up televisit. Care advise given. Patient verbalized understanding of care advise and to call back or go to ED if symptoms persist or worsen.    Reason for Disposition  [1] PERSISTING SYMPTOMS OF COVID-19 AND [2] symptoms WORSE  Answer Assessment - Initial Assessment Questions 1. COVID-19 ONSET: "When did the symptoms of COVID-19 first start?"     8 days ago  2. DIAGNOSIS CONFIRMATION: "How were you diagnosed?" (e.g., COVID-19 oral or nasal viral test; COVID-19 antibody test; doctor visit)     Na  3. MAIN SYMPTOM:  "What is your main concern or symptom right now?" (e.g., breathing difficulty, cough, fatigue. loss of smell)     Oxygen levels low per patient at 91-94% and feels dehydrated 4. SYMPTOM ONSET: "When did the  covid symptoms   start?"     8 days ago  5. BETTER-SAME-WORSE: "Are you getting better, staying the same, or getting worse over the last 1 to 2 weeks?"     Worse  6. RECENT MEDICAL VISIT: "Have you been seen by a healthcare provider (doctor, NP, PA) for these persisting COVID-19 symptoms?" If Yes, ask: "When were you seen?" (e.g., date)     No  7. COUGH: "Do you have a cough?" If Yes, ask: "How bad is the cough?"       Mild cough  8. FEVER: "Do you have a fever?" If Yes, ask: "What is your temperature, how was it measured, and when did it start?"     Yes  9. BREATHING DIFFICULTY: "Are you having any trouble breathing?" If Yes, ask: "How bad is your breathing?" (e.g., mild, moderate, severe)    -  MILD: No SOB at rest, mild SOB with walking, speaks normally in sentences, can lay down, no retractions, pulse < 100.    - MODERATE: SOB at rest, SOB with minimal exertion and prefers to sit, cannot lie down flat, speaks in phrases, mild retractions, audible wheezing, pulse 100-120.    - SEVERE: Very SOB at rest, speaks in single words, struggling to breathe, sitting hunched forward, retractions, pulse > 120       Moderate  10. HIGH RISK DISEASE: "Do you have any chronic medical problems?" (e.g., asthma, heart or lung disease, weak immune system, obesity, etc.)       HTN B/P  11. PREGNANCY: "Is there any chance you are pregnant?" "When was your last menstrual period?"       Na  12. OTHER SYMPTOMS: "Do you have any other symptoms?"  (e.g., fatigue, headache, muscle pain, weakness)       Fatigue , headache, weakness fever  Protocols used: CORONAVIRUS (COVID-19) PERSISTING SYMPTOMS FOLLOW-UP CALL-A-AH
